# Patient Record
Sex: Female | Born: 1985 | Race: Black or African American | Hispanic: No | Marital: Married | Smoking: Never smoker
Health system: Southern US, Community
[De-identification: ages and names within clinical notes are randomized; demographics above are authoritative.]

## PROBLEM LIST (undated history)

## (undated) DIAGNOSIS — Z789 Other specified health status: Secondary | ICD-10-CM

## (undated) HISTORY — PX: TOOTH EXTRACTION: SUR596

---

## 2015-10-24 NOTE — L&D Delivery Note (Signed)
Patient complete and pushing. VBAC of viable female infant over intact perineum. Nuchal cord single with true knot, easily reduced on mom's abdomen. Infant delivered to mom's abdomen. Delayed cord clamping x 2 minute. Cord clamped x 2, cut. Spontaneous cry heard.   Cord blood obtained. Placenta delivered spontaneously and intact. LUS cleared of clot. Fundus firm on exam, pitocin running.  Lacerations: None Suture: none EBL: 100 cc Anesthesia: IV Fentanyl  Apgars: 9/9 Weight: pending, skin to skin  Instrument and sponge count x2 correct.   Deniece ReeJosue D Santos, MD 08/12/2016 12:17 PM   OB FELLOW DELIVERY ATTESTATION  I was gloved and present for the delivery in its entirety, and I agree with the above resident's note.    Ernestina PennaNicholas Schenk, MD 2:06 PM

## 2016-02-02 LAB — OB RESULTS CONSOLE HEPATITIS B SURFACE ANTIGEN: HEP B S AG: NEGATIVE

## 2016-02-02 LAB — OB RESULTS CONSOLE GC/CHLAMYDIA: Chlamydia: NEGATIVE

## 2016-06-07 LAB — OB RESULTS CONSOLE HIV ANTIBODY (ROUTINE TESTING): HIV: NONREACTIVE

## 2016-08-12 ENCOUNTER — Inpatient Hospital Stay (HOSPITAL_COMMUNITY)
Admission: AD | Admit: 2016-08-12 | Discharge: 2016-08-14 | DRG: 775 | Disposition: A | Discharge status: Home / Self Care | Payer: Managed Care, Other (non HMO) | Source: Ambulatory Visit | Attending: Obstetrics & Gynecology | Admitting: Obstetrics & Gynecology

## 2016-08-12 ENCOUNTER — Encounter (HOSPITAL_COMMUNITY): Payer: Self-pay | Admitting: *Deleted

## 2016-08-12 DIAGNOSIS — O42013 Preterm premature rupture of membranes, onset of labor within 24 hours of rupture, third trimester: Secondary | ICD-10-CM | POA: Diagnosis present

## 2016-08-12 DIAGNOSIS — O34211 Maternal care for low transverse scar from previous cesarean delivery: Secondary | ICD-10-CM | POA: Diagnosis not present

## 2016-08-12 DIAGNOSIS — Z3A35 35 weeks gestation of pregnancy: Secondary | ICD-10-CM

## 2016-08-12 HISTORY — DX: Other specified health status: Z78.9

## 2016-08-12 LAB — CBC
HEMATOCRIT: 34.4 % — AB (ref 36.0–46.0)
Hemoglobin: 12 g/dL (ref 12.0–15.0)
MCH: 32.3 pg (ref 26.0–34.0)
MCHC: 34.9 g/dL (ref 30.0–36.0)
MCV: 92.7 fL (ref 78.0–100.0)
PLATELETS: 416 10*3/uL — AB (ref 150–400)
RBC: 3.71 MIL/uL — ABNORMAL LOW (ref 3.87–5.11)
RDW: 14.4 % (ref 11.5–15.5)
WBC: 18.5 10*3/uL — ABNORMAL HIGH (ref 4.0–10.5)

## 2016-08-12 LAB — ABO/RH: ABO/RH(D): A POS

## 2016-08-12 LAB — POCT FERN TEST: POCT FERN TEST: POSITIVE

## 2016-08-12 LAB — TYPE AND SCREEN
ABO/RH(D): A POS
ANTIBODY SCREEN: NEGATIVE

## 2016-08-12 MED ORDER — DIBUCAINE 1 % RE OINT: 1.0000 "application " | TOPICAL_OINTMENT | RECTAL | Status: DC | PRN

## 2016-08-12 MED ORDER — SOD CITRATE-CITRIC ACID 500-334 MG/5ML PO SOLN: 30.0000 mL | ORAL | Status: DC | PRN

## 2016-08-12 MED ORDER — OXYTOCIN 40 UNITS IN LACTATED RINGERS INFUSION - SIMPLE MED
2.5000 [IU]/h | INTRAVENOUS | Status: DC
Start: 2016-08-12 — End: 2016-08-12
  Filled 2016-08-12: qty 1000

## 2016-08-12 MED ORDER — FLEET ENEMA 7-19 GM/118ML RE ENEM: 1.0000 | ENEMA | RECTAL | Status: DC | PRN

## 2016-08-12 MED ORDER — ACETAMINOPHEN 325 MG PO TABS: 650.0000 mg | ORAL_TABLET | ORAL | Status: DC | PRN

## 2016-08-12 MED ORDER — OXYCODONE-ACETAMINOPHEN 5-325 MG PO TABS: 2.0000 | ORAL_TABLET | ORAL | Status: DC | PRN

## 2016-08-12 MED ORDER — FENTANYL CITRATE (PF) 100 MCG/2ML IJ SOLN
100.0000 ug | INTRAMUSCULAR | Status: DC | PRN
  Administered 2016-08-12 (×2): 100 ug via INTRAVENOUS
  Filled 2016-08-12 (×2): qty 2

## 2016-08-12 MED ORDER — PENICILLIN G POTASSIUM 5000000 UNITS IJ SOLR
2.5000 10*6.[IU] | INTRAVENOUS | Status: DC
  Filled 2016-08-12 (×4): qty 2.5

## 2016-08-12 MED ORDER — DIPHENHYDRAMINE HCL 25 MG PO CAPS: 25.0000 mg | ORAL_CAPSULE | Freq: Four times a day (QID) | ORAL | Status: DC | PRN

## 2016-08-12 MED ORDER — OXYCODONE-ACETAMINOPHEN 5-325 MG PO TABS: 1.0000 | ORAL_TABLET | ORAL | Status: DC | PRN

## 2016-08-12 MED ORDER — BETAMETHASONE SOD PHOS & ACET 6 (3-3) MG/ML IJ SUSP
12.0000 mg | Freq: Once | INTRAMUSCULAR | Status: AC
  Administered 2016-08-12: 12 mg via INTRAMUSCULAR
  Filled 2016-08-12: qty 2

## 2016-08-12 MED ORDER — ONDANSETRON HCL 4 MG/2ML IJ SOLN: 4.0000 mg | Freq: Four times a day (QID) | INTRAMUSCULAR | Status: DC | PRN

## 2016-08-12 MED ORDER — LACTATED RINGERS IV SOLN: 500.0000 mL | INTRAVENOUS | Status: DC | PRN

## 2016-08-12 MED ORDER — TETANUS-DIPHTH-ACELL PERTUSSIS 5-2.5-18.5 LF-MCG/0.5 IM SUSP: 0.5000 mL | Freq: Once | INTRAMUSCULAR | Status: DC

## 2016-08-12 MED ORDER — ONDANSETRON HCL 4 MG PO TABS: 4.0000 mg | ORAL_TABLET | ORAL | Status: DC | PRN

## 2016-08-12 MED ORDER — PRENATAL MULTIVITAMIN CH
1.0000 | ORAL_TABLET | Freq: Every day | ORAL | Status: DC
  Administered 2016-08-13 – 2016-08-14 (×2): 1 via ORAL
  Filled 2016-08-12 (×2): qty 1

## 2016-08-12 MED ORDER — ZOLPIDEM TARTRATE 5 MG PO TABS: 5.0000 mg | ORAL_TABLET | Freq: Every evening | ORAL | Status: DC | PRN

## 2016-08-12 MED ORDER — COCONUT OIL OIL: 1.0000 "application " | TOPICAL_OIL | Status: DC | PRN

## 2016-08-12 MED ORDER — WITCH HAZEL-GLYCERIN EX PADS: 1.0000 "application " | MEDICATED_PAD | CUTANEOUS | Status: DC | PRN

## 2016-08-12 MED ORDER — BENZOCAINE-MENTHOL 20-0.5 % EX AERO: 1.0000 "application " | INHALATION_SPRAY | CUTANEOUS | Status: DC | PRN

## 2016-08-12 MED ORDER — SIMETHICONE 80 MG PO CHEW: 80.0000 mg | CHEWABLE_TABLET | ORAL | Status: DC | PRN

## 2016-08-12 MED ORDER — IBUPROFEN 600 MG PO TABS
600.0000 mg | ORAL_TABLET | Freq: Four times a day (QID) | ORAL | Status: DC
  Administered 2016-08-12 – 2016-08-14 (×7): 600 mg via ORAL
  Filled 2016-08-12 (×9): qty 1

## 2016-08-12 MED ORDER — OXYTOCIN 40 UNITS IN LACTATED RINGERS INFUSION - SIMPLE MED
INTRAVENOUS | Status: AC
  Filled 2016-08-12: qty 1000

## 2016-08-12 MED ORDER — SENNOSIDES-DOCUSATE SODIUM 8.6-50 MG PO TABS
2.0000 | ORAL_TABLET | ORAL | Status: DC
  Administered 2016-08-12 – 2016-08-14 (×2): 2 via ORAL
  Filled 2016-08-12 (×2): qty 2

## 2016-08-12 MED ORDER — LACTATED RINGERS IV SOLN
INTRAVENOUS | Status: DC
  Administered 2016-08-12: 10:00:00 via INTRAVENOUS

## 2016-08-12 MED ORDER — DEXTROSE 5 % IV SOLN
5.0000 10*6.[IU] | Freq: Once | INTRAVENOUS | Status: AC
  Administered 2016-08-12: 5 10*6.[IU] via INTRAVENOUS
  Filled 2016-08-12: qty 5

## 2016-08-12 MED ORDER — OXYTOCIN BOLUS FROM INFUSION
500.0000 mL | Freq: Once | INTRAVENOUS | Status: AC
  Administered 2016-08-12: 500 mL via INTRAVENOUS

## 2016-08-12 MED ORDER — LIDOCAINE HCL (PF) 1 % IJ SOLN
30.0000 mL | INTRAMUSCULAR | Status: DC | PRN
Start: 2016-08-12 — End: 2016-08-12
  Filled 2016-08-12: qty 30

## 2016-08-12 MED ORDER — ONDANSETRON HCL 4 MG/2ML IJ SOLN: 4.0000 mg | INTRAMUSCULAR | Status: DC | PRN

## 2016-08-12 NOTE — MAU Note (Signed)
Called Labor & Delivery with update on cervical change 7.5/90/-1 no bed available.

## 2016-08-12 NOTE — Progress Notes (Signed)
Dr. Genevie AnnSchenk informed of variable decels, pt very uncomfortable, has had one dose of fentanyl.  No new orders @ this time.

## 2016-08-12 NOTE — Lactation Note (Signed)
This note was copied from a baby's chart. Lactation Consultation Note  Patient Name: Megan Terrell ZOXWR'UToday's Date: 08/12/2016 Reason for consult: Initial assessment Baby at 6 hr of life. Upon entry DEBP was set up and green sheet was on the table. Mom has not used it yet. Reviewed the pump and milk storage. Mom stated she can manually express and has spoon in the room. She had questions about spoon feeding. She is worried because baby is very sleepy. She stated baby does latch well for about 5 minutes, then he will not "suck anymore".  Discussed LPT baby behavior, feeding frequency, pumping, supplementing guidelines, baby belly size, voids, wt loss, breast changes, and nipple care. Mom reported that baby should eat again at 1945. Left lactation number on the white board for mom to call. Given lactation handouts. Aware of OP services and support group.    Maternal Data Has patient been taught Hand Expression?: Yes Does the patient have breastfeeding experience prior to this delivery?: No  Feeding Feeding Type: Breast Fed Length of feed: 7 min  LATCH Score/Interventions                      Lactation Tools Discussed/Used Pump Review: Setup, frequency, and cleaning;Milk Storage Date initiated:: 08/12/16   Consult Status Consult Status: Follow-up Date: 08/12/16 Follow-up type: In-patient    [name] Megan Terrell 08/12/2016, 6:49 PM

## 2016-08-12 NOTE — H&P (Signed)
LABOR AND DELIVERY ADMISSION HISTORY AND PHYSICAL NOTE  Megan LucksLashanda Terrell is a 30 y.o. female [phone] with IUP at 7684w6d by early ultrasound presenting for pre-term labor with SROM at 600AM this AM. [facility] and was planning on delivering there, but since she was pre-term came here.  Her last delivery was via c-section for arrest of dilation. This was 1 year ago. She had successful delivery after that.    She reports positive fetal movement. She denies leakage of fluid or vaginal bleeding.  Prenatal History/Complications:  Past Medical History: Past Medical History:  Diagnosis Date  . Medical history non-contributory     Past Surgical History: Past Surgical History:  Procedure Laterality Date  . CESAREAN SECTION    . TOOTH EXTRACTION      Obstetrical History: OB History    Gravida Para Term Preterm AB Living   5 2 2   2 2    SAB TAB Ectopic Multiple Live Births     2     2      Social History: Social History   Social History  . Marital status: Married    Spouse name: N/A  . Number of children: N/A  . Years of education: N/A   Social History Main Topics  . Smoking status: Never Smoker  . Smokeless tobacco: Never Used  . Alcohol use No  . Drug use: No  . Sexual activity: Yes   Other Topics Concern  . None   Social History Narrative  . None    Family History: History reviewed. No pertinent family history.  Allergies: No Known Allergies  Prescriptions Prior to Admission  Medication Sig Dispense Refill Last Dose  . acetaminophen (TYLENOL) 500 MG tablet Take 1,000 mg by mouth every 6 (six) hours as needed for mild pain, moderate pain or headache.   Past Month at Unknown time  . metroNIDAZOLE (FLAGYL) 500 MG tablet Take 500 mg by mouth 2 (two) times daily.   08/11/2016 at Unknown time  . nystatin-triamcinolone (MYCOLOG II) cream Apply 1 application topically 2 (two) times daily as needed (for irritation).   Past Week at Unknown time  .  Prenatal MV-Min-FA-Omega-3 (PRENATAL GUMMIES/DHA & FA) 0.4-32.5 MG CHEW Chew 2 each by mouth daily.   08/11/2016 at Unknown time     Review of Systems   All systems reviewed and negative except as stated in HPI  Blood pressure 124/74, pulse 89, temperature 98.2 F (36.8 C), temperature source Oral, resp. rate 18. General appearance: alert, cooperative and appears stated age Lungs: clear to auscultation bilaterally Heart: regular rate and rhythm Abdomen: soft, non-tender; bowel sounds normal Extremities: No calf swelling or tenderness Presentation: cephalic by ultrasound in MAU Fetal monitoring: category 2, occasional varriables Uterine activity: contractions every 5 minutes Dilation: 7.5 Effacement (%): 90 Station: -1 Exam by:: Laurell Josephsheryl Anderson RN   Prenatal labs: ABO, Rh:   A+ Antibody:   negative Rubella: !Error! RPR:    HBsAg:   negative HIV:   Non-reactive GBS:   not colected 1 hr Glucola: 108 Anatomy US: normal   Prenatal Transfer Tool  Maternal Diabetes: No Genetic Screening: Declined Maternal Ultrasounds/Referrals: Normal Fetal Ultrasounds or other Referrals:  None Maternal Substance Abuse:  No Significant Maternal Medications:  None Significant Maternal Lab Results: None  Results for orders placed or performed during the hospital encounter of 08/12/16 (from the past 24 hour(s))  Fern Test   Collection Time: 08/12/16  7:57 AM  Result Value Ref  Range   POCT Fern Test Positive = ruptured amniotic membanes   CBC   Collection Time: 08/12/16  9:44 AM  Result Value Ref Range   WBC 18.5 (H) 4.0 - 10.5 K/uL   RBC 3.71 (L) 3.87 - 5.11 MIL/uL   Hemoglobin 12.0 12.0 - 15.0 g/dL   HCT 16.1 (L) 09.6 - 04.5 %   MCV 92.7 78.0 - 100.0 fL   MCH 32.3 26.0 - 34.0 pg   MCHC 34.9 30.0 - 36.0 g/dL   RDW 40.9 81.1 - 91.4 %   Platelets 416 (H) 150 - 400 K/uL    There are no active problems to display for this patient.   Assessment: Megan Terrell is a 30 y.o. [id number]  at [redacted]w[redacted]d here for Pre-term labor with SROM @ 6AM  #Labor:PRe-term labor, expectant management, not safe for transfer to her institution of pre-natal care.  #Pain: IV fentanyl, by the time made to L& D to late for epidrual #FWB: Category 2 occasional varriables #ID:  GBS unknown and pre-term so will treat #MOF: breast #pre-term labor: given one-dose of betamethasone in MAU  Ernestina Penna 08/12/2016, 11:06 AM

## 2016-08-12 NOTE — Lactation Note (Signed)
This note was copied from a baby's chart. Lactation Consultation Note  Patient Name: Megan Terrell ZOXWR'UToday's Date: 08/12/2016 Reason for consult: Follow-up assessment Baby at 8 hr of life. Mom called for latch help. Mom has large breast with nipples at the bottom of the breast. Mom likes to hold baby in her lap with baby latched to just the tip of the nipple. Demonstrated cross cradle, mom was nervious that baby could not breath. Mom did well keeping baby going for 10 minutes of good sucking. Mom was able to easily express large drops of colostrum on the L breast and small drops on the R breast. Mom was ok with latching, manual expression, and post pumping at this feeding. She stated she was tired and not sure she can keep "all of this up by myself". Encouraged to call for help as needed and stressed that this feeding plan can be changed to introduce formula if she would like. She desires to ebf at this time. She will offer the breast on demand 8+/24hr, post pump, and supplement with her milk or formula per LPT infant guidelines. RN given report.   Maternal Data Has patient been taught Hand Expression?: Yes Does the patient have breastfeeding experience prior to this delivery?: No  Feeding Feeding Type: Breast Fed Length of feed: 20 min  LATCH Score/Interventions Latch: Grasps breast easily, tongue down, lips flanged, rhythmical sucking. Intervention(s): Breast compression;Adjust position  Audible Swallowing: A few with stimulation Intervention(s): Hand expression;Skin to skin Intervention(s): Alternate breast massage  Type of Nipple: Everted at rest and after stimulation  Comfort (Breast/Nipple): Soft / non-tender     Hold (Positioning): Assistance needed to correctly position infant at breast and maintain latch. Intervention(s): Support Pillows;Position options  LATCH Score: 8  Lactation Tools Discussed/Used Pump Review: Setup, frequency, and cleaning;Milk Storage Date  initiated:: 08/12/16   Consult Status Consult Status: Follow-up Date: 08/13/16 Follow-up type: In-patient    Megan Terrell 08/12/2016, 8:10 PM

## 2016-08-12 NOTE — MAU Note (Signed)
Pt states she started trickling fluid approx 2 hours ago, was clear fluid, now pink.  Denies bleeding.  Having mild cramping.

## 2016-08-12 NOTE — MAU Note (Signed)
Spoke with Stephens ShireH. Santos MD patient now 7.5/90 he will communicate this to Dr. Genevie AnnSchenk.

## 2016-08-13 LAB — RPR: RPR: NONREACTIVE

## 2016-08-13 LAB — RUBELLA SCREEN: Rubella: 1.52 index (ref >0.99)

## 2016-08-13 MED ORDER — CEPHALEXIN 500 MG PO CAPS
500.0000 mg | ORAL_CAPSULE | Freq: Four times a day (QID) | ORAL | Status: DC
  Administered 2016-08-13 – 2016-08-14 (×7): 500 mg via ORAL
  Filled 2016-08-13 (×10): qty 1

## 2016-08-13 NOTE — Lactation Note (Signed)
This note was copied from a baby's chart. Lactation Consultation Note  Patient Name: Megan Terrell ZOXWR'UToday's Date: 08/13/2016 Reason for consult: Follow-up assessment Baby at 32 hr of life. Mom called for lactation because she bf baby on each breast for 10 minutes then offered 10 ml of breast milk and baby was still "acting hungry". Upon entry mom was trying to spoon feed. Baby appeared unsettle but was not giving hunger cues. Baby had a wet and stool diaper, after FOB changed baby's diaper and swaddled him, he calmed. Had mom use the DEBP while FOB was tending to baby. Mom is worried that she is not making enough milk. Discussed LPT infant behavior and feeding guidelines. She wants to continue with ebf for now but is aware that supplementing with formula is possible. Mom will offer the breast q3hr on demand and supplement after each bf per volume guidelines. She will try to use the DEBP after each feeding. She will call for help as needed.   Maternal Data    Feeding Feeding Type: Breast Fed Length of feed: 20 min  LATCH Score/Interventions                      Lactation Tools Discussed/Used     Consult Status Consult Status: Follow-up Date: 08/14/16 Follow-up type: In-patient    [name] 08/13/2016, 8:27 PM

## 2016-08-13 NOTE — Lactation Note (Signed)
This note was copied from a baby's chart. Lactation Consultation Note  Patient Name: Megan Valentina LucksLashanda Gwinner WUJWJ'XToday's Date: 08/13/2016 Reason for consult: Follow-up assessment Baby at 28 hr of life. Mom has been bf on demand. She has not been pumping or manually expressing after bf. Stressed the importance of offering her milk in addition to bf per LPT infant feeding policy. At this visit mom was able to easily manually express large drops of colostrum bilaterally. Assisted her with spoon feeding. She is worried because the milk is clear to white in color, assured her this is normal. Mom will offer the breast on demand q3hr and supplement after every bf. She will use the DEBP in addition to manual express and bf. She is aware that she will need to use formula if she does not have expressed milk or chooses not to express her milk. She has lactation number on the white to call for help as needed.     Maternal Data    Feeding Feeding Type: Breast Fed Length of feed: 15 min  LATCH Score/Interventions Latch: Grasps breast easily, tongue down, lips flanged, rhythmical sucking.  Audible Swallowing: Spontaneous and intermittent  Type of Nipple: Everted at rest and after stimulation  Comfort (Breast/Nipple): Soft / non-tender     Hold (Positioning): No assistance needed to correctly position infant at breast.  LATCH Score: 10  Lactation Tools Discussed/Used     Consult Status Consult Status: Follow-up Date: 08/14/16 Follow-up type: In-patient    Megan Terrell 08/13/2016, 4:25 PM

## 2016-08-13 NOTE — Progress Notes (Signed)
Post Partum Day 1 Subjective: no complaints, up ad lib, voiding and tolerating PO  Objective: Blood pressure 118/63, pulse 88, temperature 98 F (36.7 C), temperature source Oral, resp. rate 20, height 5\' 4"  (1.626 m), weight 273 lb (123.8 kg), SpO2 99 %, unknown if currently breastfeeding.  Physical Exam:  General: alert, cooperative, appears stated age and no distress Lochia: appropriate Uterine Fundus: firm Incision: n/a DVT Evaluation: No evidence of DVT seen on physical exam.   Recent Labs  08/12/16 0944  HGB 12.0  HCT 34.4*    Assessment/Plan: Plan for discharge tomorrow   LOS: 1 day   Megan Terrell 08/13/2016, 8:48 AM

## 2016-08-14 ENCOUNTER — Ambulatory Visit: Payer: Self-pay

## 2016-08-14 MED ORDER — IBUPROFEN 600 MG PO TABS: 600.0000 mg | ORAL_TABLET | Freq: Four times a day (QID) | ORAL | 0 refills | Status: AC

## 2016-08-14 MED ORDER — CEPHALEXIN 500 MG PO CAPS: 500.0000 mg | ORAL_CAPSULE | Freq: Four times a day (QID) | ORAL | 0 refills | Status: AC

## 2016-08-14 NOTE — Lactation Note (Addendum)
This note was copied from a baby's chart. Lactation Consultation Note LPI 35 6/7 weeks. Baby laying between moms breast STS. Mom stated her milk is coming in. Noted moms breast looking as if filling. Breast heavy, hand expressed squirting colostrum. Swaddled baby and placed in crib. Baby jittery. Monitored. Encouraged mom to pump w/DEBP. Noted small amount. Hand expressed 8ml from Rt. Breast 4ml lt. Breast. Discussed filling, and engorgement management and prevention. Mom has a 9510 months old at home that she BF for 3-4 months.  Noted baby laying in crib jittery swaddled. Temp. Taken by tech. Slightly warm. Informed has been STS for most of night per mom. Baby had 2 blankets on. 1 blanket was removed by tech.  Discussed w/mom the need to supplement after BF w/colostrum or formula or both. Encouraged to give colostrum first. Mom has been spoon feeding colostrum. Gave foley cup w/demonstration. Mom tried to wake baby to give colostrum. Baby not interested. W/gloved finger tried to stimulate to suckle. Noted very high palate, no good suck noted.  Notified RN of jittery being swaddled. Blood sugar 95.  Patient Name: Megan Terrell [name] ZOXWR'UToday's Date: 08/14/2016 Reason for consult: Follow-up assessment;Infant < 6lbs;Late preterm infant   Maternal Data    Feeding Feeding Type: Breast Fed Length of feed: 15 min  LATCH Score/Interventions Latch: Too sleepy or reluctant, no latch achieved, no sucking elicited. Intervention(s): Skin to skin;Waking techniques Intervention(s): Breast compression;Breast massage  Intervention(s): Hand expression;Alternate breast massage  Type of Nipple: Everted at rest and after stimulation  Comfort (Breast/Nipple): Filling, red/small blisters or bruises, mild/mod discomfort  Problem noted: Filling Interventions (Filling): Massage;Double electric pump  Intervention(s): Breastfeeding basics reviewed;Support Pillows;Position options;Skin to skin     Lactation Tools  Discussed/Used Tools: Pump Breast pump type: Double-Electric Breast Pump   Consult Status Consult Status: Follow-up Date: 08/14/16 Follow-up type: In-patient    Anterrio Mccleery, Diamond NickelLAURA G 08/14/2016, 3:50 AM

## 2016-08-14 NOTE — Discharge Summary (Signed)
        OB Discharge Summary  Patient Name: Megan Terrell DOB: 04/28/1986 MRN: [id number]  Date of admission: 08/12/2016 Delivering MD: Youlanda MightySANTOS, JOSUE D   Date of discharge: 08/14/2016  Admitting diagnosis: 4736 WEEKS AND THINKS HER WATER BROKE. Intrauterine pregnancy: 7443w6d     Secondary diagnosis:Active Problems:   Preterm labor  Additional problems:none     Discharge diagnosis: Preterm Pregnancy Delivered                                                                     Post partum procedures:n/a  Augmentation: none  Complications: None  Hospital course:  Onset of Labor With Vaginal Delivery     30 y.o. yo [id number] at 4443w6d was admitted in Active Labor on 08/12/2016. Patient had an uncomplicated labor course as follows:  Membrane Rupture Time/Date: 5:45 AM ,08/12/2016   Intrapartum Procedures: Episiotomy: None [1]                                         Lacerations:  None [1]  Patient had a delivery of a Viable infant. 08/12/2016  Information for the patient's newborn:  Cecilie LowersSedjro, Boy Kenlei [[id number]][[id number]]  Delivery Method: VBAC, Spontaneous (Filed from Delivery Summary)    Pateint had an uncomplicated postpartum course.  She is ambulating, tolerating a regular diet, passing flatus, and urinating well. Patient is discharged home in stable condition on 08/14/16.    Physical exam Vitals:   08/12/16 1500 08/12/16 2000 08/13/16 0500 08/13/16 1741  BP: 128/64 (!) 118/55 118/63 (!) 116/48  Pulse: 91 82 88 82  Resp: 20 18 20 20   Temp: 98.5 F (36.9 C) 98.2 F (36.8 C) 98 F (36.7 C) 98.6 F (37 C)  TempSrc: Oral Axillary Oral Oral  SpO2: 99% 98% 99%   Weight:      Height:       General: alert, cooperative and no distress Lochia: appropriate Uterine Fundus: firm Incision: N/A DVT Evaluation: No evidence of DVT seen on physical exam. Labs: Lab Results  Component Value Date   WBC 18.5 (H) 08/12/2016   HGB 12.0 08/12/2016   HCT 34.4 (L) 08/12/2016   MCV 92.7  08/12/2016   PLT 416 (H) 08/12/2016   No flowsheet data found.  Discharge instruction: per After Visit Summary and "Baby and Me Booklet".    Diet: routine diet  Activity: Advance as tolerated. Pelvic rest for 6 weeks.   Outpatient follow up:6 weeks Follow up Appt:No future appointments. Follow up visit: No Follow-up on file.  Postpartum contraception: Nexplanon  Newborn Data: Live born female  Birth Weight: 5 lb 7.7 oz (2485 g) APGAR: 9, 9  Baby Feeding: Bottle and Breast Disposition:home with mother   08/14/2016 Wyvonnia DuskyMarie Lawson, CNM

## 2016-08-14 NOTE — Lactation Note (Signed)
This note was copied from a baby's chart. Lactation Consultation Note Follow up visit at 58 hours of age.  Mom reports waking baby for feedings, baby latching for about 10 minutes and then she is supplementing with EBM in bottle.  Mom reports baby is sleepy during bottle and only took 20 of 25 mls at last feeding.  LC encouraged mom to offer at least 20mls at each feeding and increase as baby tolerates.  Mom to call RN for assist if baby is not tolerating feeding.  Mom reports baby is latching well and she denies concerns at this time.   Baby has had 8+ feedings with supplement with EBM.  Mom reports she is post pumping with each feeding.  Baby has had 7voids and 5 stools in past 24 hours.   Mom to call for assist as needed.    Patient Name: Megan Valentina LucksLashanda Guido UJWJX'BToday's Date: 08/14/2016 Reason for consult: Follow-up assessment;Infant < 6lbs;Late preterm infant   Maternal Data    Feeding Feeding Type: Bottle Fed - Breast Milk Nipple Type: Slow - flow Length of feed: 10 min  LATCH Score/Interventions                      Lactation Tools Discussed/Used     Consult Status Consult Status: Follow-up Date: 08/15/16 Follow-up type: In-patient    Shoptaw, Arvella MerlesJana Lynn 08/14/2016, 10:26 PM

## 2016-08-14 NOTE — Lactation Note (Signed)
This note was copied from a baby's chart. Lactation Consultation Note LPI is  Patient Name: Megan Terrell UJWJX'BToday's Date: 08/14/2016 Reason for consult: Follow-up assessment;Infant < 6lbs;Late preterm infant   Maternal Data    Feeding    LATCH Score/Interventions                      Lactation Tools Discussed/Used     Consult Status Consult Status: Follow-up Date: 08/14/16 (in pm) Follow-up type: In-patient    Teon Hudnall, Diamond NickelLAURA G 08/14/2016, 3:13 AM

## 2016-08-14 NOTE — Lactation Note (Signed)
This note was copied from a baby's chart. Lactation Consultation Note  Patient Name: Megan Terrell Valentina LucksLashanda Tramble UJWJX'BToday's Date: 08/14/2016 Reason for consult: Follow-up assessment  Mom has visitors in room; she would like Lactation to return at a later time. I requested that she call lactation to come back later to assess breastfeeding. Mom was made aware that since infant is now 6948 hours old, infant likely needs more volume when supplementing. [name], [facility] 08/14/2016, 12:10 PM

## 2016-08-15 ENCOUNTER — Ambulatory Visit: Payer: Self-pay

## 2016-08-15 NOTE — Lactation Note (Addendum)
This note was copied from a baby's chart. Lactation Consultation Note  Baby is late preterm infant.  Assistance provided with feeding plan. Baby 69 hours old.  7037w6d.  < 6lbs. Mother recently pumped approx 30 ml and FOB gave baby 20 ml with slow flow nipple. FOB then gave the additional 10 ml. Infant sleeping after feeding in FOB's arms. [facility]. Left LC phone number and suggest they call with next feeding. Reviewed volume guidelines. Mom encouraged to feed baby 8-12 times/24 hours and with feeding cues at least every 3 hours. Reviewed engorgement care and monitoring voids/stools.   Patient Name: Megan Terrell ZOXWR'UToday's Date: 08/15/2016 Reason for consult: Infant < 6lbs;Late preterm infant;Follow-up assessment   Maternal Data    Feeding Feeding Type: Bottle Fed - Breast Milk Length of feed: 20 min  LATCH Score/Interventions                      Lactation Tools Discussed/Used     Consult Status Consult Status: Follow-up Date: 08/16/16 Follow-up type: In-patient    [name], Betzabe Bevans [facility] 08/15/2016, 9:09 AM

## 2016-08-15 NOTE — Lactation Note (Signed)
This note was copied from a baby's chart. Lactation Consultation Note  Follow up due to check latch on late preterm infant. Mother latched baby in cross cradle hold.  A few swallows observed  With stimulation and then baby became sleepy. Demonstrated how to compress/massage breast to keep baby active. Mother tends to hold her breast tissue back from infant's nose - provided education. Suggest if baby is sleepy at the breast to stop feeding after 10 min. If baby is vigorous at the breast mother can bf infant for up to 30 min. Give supplemental breastmilk after each feeding. Mother [facility]. Discussed how to warm breastmilk.  Patient Name: Megan Terrell's Date: 08/15/2016 Reason for consult: Follow-up assessment   Maternal Data    Feeding Feeding Type: Breast Fed Length of feed: 8 min  LATCH Score/Interventions Latch: Grasps breast easily, tongue down, lips flanged, rhythmical sucking. Intervention(s): Waking techniques  Audible Swallowing: A few with stimulation  Type of Nipple: Everted at rest and after stimulation  Comfort (Breast/Nipple): Soft / non-tender     Hold (Positioning): Assistance needed to correctly position infant at breast and maintain latch.  LATCH Score: 8  Lactation Tools Discussed/Used     Consult Status Consult Status: Complete Date: 08/16/16 Follow-up type: In-patient    [name], [name] 08/15/2016, 10:14 AM

## 2017-07-05 ENCOUNTER — Encounter (HOSPITAL_COMMUNITY): Payer: Self-pay

## 2017-07-05 ENCOUNTER — Emergency Department (HOSPITAL_COMMUNITY)
Admission: EM | Admit: 2017-07-05 | Discharge: 2017-07-05 | Disposition: A | Discharge status: Home / Self Care | Payer: 59 | Attending: Emergency Medicine | Admitting: Emergency Medicine

## 2017-07-05 ENCOUNTER — Emergency Department (HOSPITAL_COMMUNITY): Payer: 59

## 2017-07-05 DIAGNOSIS — R0789 Other chest pain: Secondary | ICD-10-CM | POA: Insufficient documentation

## 2017-07-05 DIAGNOSIS — Z79899 Other long term (current) drug therapy: Secondary | ICD-10-CM | POA: Diagnosis not present

## 2017-07-05 DIAGNOSIS — R079 Chest pain, unspecified: Secondary | ICD-10-CM | POA: Diagnosis present

## 2017-07-05 LAB — CBC
HCT: 37.1 % (ref 36.0–46.0)
Hemoglobin: 12.3 g/dL (ref 12.0–15.0)
MCH: 30.6 pg (ref 26.0–34.0)
MCHC: 33.2 g/dL (ref 30.0–36.0)
MCV: 92.3 fL (ref 78.0–100.0)
Platelets: 356 10*3/uL (ref 150–400)
RBC: 4.02 MIL/uL (ref 3.87–5.11)
RDW: 13.6 % (ref 11.5–15.5)
WBC: 8.2 10*3/uL (ref 4.0–10.5)

## 2017-07-05 LAB — BASIC METABOLIC PANEL
ANION GAP: 6 (ref 5–15)
BUN: 12 mg/dL (ref 6–20)
CALCIUM: 9.2 mg/dL (ref 8.9–10.3)
CHLORIDE: 108 mmol/L (ref 101–111)
CO2: 24 mmol/L (ref 22–32)
Creatinine, Ser: 0.75 mg/dL (ref 0.44–1.00)
GFR calc non Af Amer: >60 mL/min (ref >60)
Glucose, Bld: 101 mg/dL — ABNORMAL HIGH (ref 65–99)
Potassium: 4.1 mmol/L (ref 3.5–5.1)
Sodium: 138 mmol/L (ref 135–145)

## 2017-07-05 LAB — I-STAT TROPONIN, ED
TROPONIN I, POC: 0 ng/mL (ref 0.00–0.08)
Troponin i, poc: 0 ng/mL (ref 0.00–0.08)

## 2017-07-05 LAB — I-STAT BETA HCG BLOOD, ED (MC, WL, AP ONLY)

## 2017-07-05 MED ORDER — KETOROLAC TROMETHAMINE 15 MG/ML IJ SOLN
15.0000 mg | Freq: Once | INTRAMUSCULAR | Status: AC
  Administered 2017-07-05: 15 mg via INTRAMUSCULAR
  Filled 2017-07-05: qty 1

## 2017-07-05 NOTE — ED Provider Notes (Signed)
[facility]-EMERGENCY DEPT Provider Note   CSN: [id number] Arrival date & time: 07/05/17  1110     History   Chief Complaint Chief Complaint  Patient presents with  . Chest Pain  . Cough    HPI Megan Terrell is a 31 y.o. female.  HPI Patient presents to ED for evaluation of chest pain that has been intermittent for the past week. Pain is located mid sternally and worse with lifting, inspiration. States that she did lift a heavy object at work when symptoms began one week ago. Reports history of similar symptoms in the past that have been intermittent. She denies any cough, hemoptysis, palpitations, leg swelling, recent surgery, history of cancer, OCP use, prior DVT or PE, recent prolonged travel, history of cardiac issues, fever  Past Medical History:  Diagnosis Date  . Medical history non-contributory     Patient Active Problem List   Diagnosis Date Noted  . Preterm labor 08/12/2016    Past Surgical History:  Procedure Laterality Date  . CESAREAN SECTION    . TOOTH EXTRACTION      OB History    Gravida Para Term Preterm AB Living   SAB TAB Ectopic Multiple Live Births     2   0 3       Home Medications    Prior to Admission medications   Medication Sig Start Date End Date Taking? Authorizing Provider  acetaminophen (TYLENOL) 500 MG tablet Take 1,000 mg by mouth every 6 (six) hours as needed for mild pain, moderate pain or headache.    [provider]  cephALEXin (KEFLEX) 500 MG capsule Take 1 capsule (500 mg total) by mouth 4 (four) times daily. 08/14/16   Montez Morita, CNM  ibuprofen (ADVIL,MOTRIN) 600 MG tablet Take 1 tablet (600 mg total) by mouth every 6 (six) hours. 08/14/16   Montez Morita, CNM  nystatin-triamcinolone (MYCOLOG II) cream Apply 1 application topically 2 (two) times daily as needed (for irritation).    [provider]  Prenatal MV-Min-FA-Omega-3 (PRENATAL GUMMIES/DHA & FA) 0.4-32.5 MG CHEW Chew 2 each by mouth  daily.    [provider]    Family History No family history on file.  Social History Social History  Substance Use Topics  . Smoking status: Never Smoker  . Smokeless tobacco: Never Used  . Alcohol use No     Allergies   Patient has no known allergies.   Review of Systems Review of Systems  Constitutional: Negative for appetite change, chills and fever.  HENT: Negative for ear pain, rhinorrhea, sneezing and sore throat.   Eyes: Negative for photophobia and visual disturbance.  Respiratory: Negative for cough, chest tightness, shortness of breath and wheezing.   Cardiovascular: Positive for chest pain. Negative for palpitations.  Gastrointestinal: Negative for abdominal pain, blood in stool, constipation, diarrhea, nausea and vomiting.  Genitourinary: Negative for dysuria, hematuria and urgency.  Musculoskeletal: Negative for myalgias.  Skin: Negative for rash.  Neurological: Negative for dizziness, weakness and light-headedness.     Physical Exam Updated Vital Signs BP 112/88   Pulse 80   Temp 98.3 F (36.8 C) (Oral)   Resp 18   SpO2 99%   Physical Exam  Constitutional: She appears well-developed and well-nourished. No distress.  Nontoxic appearing and in no acute distress. Resting comfortably on bed and speaking in full sentences.  HENT:  Head: Normocephalic and atraumatic.  Nose: Nose normal.  Eyes: Conjunctivae and EOM are  normal. Left eye exhibits no discharge. No scleral icterus.  Neck: Normal range of motion. Neck supple.  Cardiovascular: Normal rate, regular rhythm, normal heart sounds and intact distal pulses.  Exam reveals no gallop and no friction rub.   No murmur heard. Pulmonary/Chest: Effort normal and breath sounds normal. No respiratory distress. She exhibits tenderness.    Abdominal: Soft. Bowel sounds are normal. She exhibits no distension. There is no tenderness. There is no guarding.  Musculoskeletal: Normal range of motion. She  exhibits no edema.  No lower extremity edema noted. No calf tenderness.  Neurological: She is alert. She exhibits normal muscle tone. Coordination normal.  Skin: Skin is warm and dry. No rash noted.  Psychiatric: She has a normal mood and affect.  Nursing note and vitals reviewed.    ED Treatments / Results  Labs (all labs ordered are listed, but only abnormal results are displayed) Labs Reviewed  BASIC METABOLIC PANEL - Abnormal; Notable for the following:       Result Value   Glucose, Bld 101 (*)    All other components within normal limits  CBC  I-STAT TROPONIN, ED  I-STAT TROPONIN, ED  I-STAT BETA HCG BLOOD, ED ([facility], [facility], [facility] ONLY)    EKG  EKG Interpretation None       Radiology Dg Chest 2 View  Result Date: 07/05/2017 CLINICAL DATA:  Sharp chest pain for 1 week. EXAM: CHEST  2 VIEW COMPARISON:  None. FINDINGS: The heart size and mediastinal contours are within normal limits. Both lungs are clear. The visualized skeletal structures are unremarkable. IMPRESSION: No active cardiopulmonary disease. Electronically Signed   By: Signa Kell M.D.   On: 07/05/2017 11:34    Procedures Procedures (including critical care time)  Medications Ordered in ED Medications  ketorolac (TORADOL) 15 MG/ML injection 15 mg (not administered)     Initial Impression / Assessment and Plan / ED Course  I have reviewed the triage vital signs and the nursing notes.  Pertinent labs & imaging results that were available during my care of the patient were reviewed by me and considered in my medical decision making (see chart for details).     Patient presents to ED for evaluation of intermittent chest pain for the past week.Pain worse with lifting and inspiration. She did lift a heavy object at work when symptoms began. History of similar symptoms have resolved without intervention in the past. On physical exam she does have tenderness to palpation of the chest as indicated in the image  above. She has no lower extremity edema that would concern me for a DVT. She has no history of PE, hemoptysis, recent surgery, OCP use, history of cancer. She is PERC negative. Troponin negative 2. EKG shows normal sinus rhythm. Laboratory including CBC, BMP and hCG unremarkable. She is afebrile. X-ray with no evidence of acute cardiopulmonary disease. I suspect that her symptoms could be due to a musculoskeletal strain in the area and chest wall pain. I have low suspicion for PE based on her risk factors. She has no other cardiac history and both troponins were negative so I have low suspicion for cardiac cause of her chest pain. Patient given Toradol here in the ED and advised to continue anti-inflammatories, heat therapy for symptoms. Patient appears stable for discharge at this time. Strict return precautions given.  Final Clinical Impressions(s) / ED Diagnoses   Final diagnoses:  Chest wall pain    New Prescriptions New Prescriptions   No medications on file  Dietrich PatesKhatri, Shamela Haydon, PA-C 07/05/17 1849    Benjiman CorePickering, Nathan, MD 07/06/17 [phone]

## 2017-07-05 NOTE — Discharge Instructions (Signed)
Please read attached information regarding your condition and heat therapy. Take ibuprofen or Aleve as needed for pain. Return to ED for severe chest pain, trouble breathing, coughing up blood, leg swelling, fevers.

## 2017-07-05 NOTE — ED Triage Notes (Signed)
Patient complains of 1 week of sharp chest pain. States that she does a lot of lifting at work and the pain is worse with inspiration, NAD

## 2020-01-08 ENCOUNTER — Ambulatory Visit: Payer: 59 | Attending: Internal Medicine

## 2020-01-08 DIAGNOSIS — Z20822 Contact with and (suspected) exposure to covid-19: Secondary | ICD-10-CM | POA: Insufficient documentation

## 2020-01-10 LAB — NOVEL CORONAVIRUS, NAA: SARS-CoV-2, NAA: NOT DETECTED

## 2020-01-12 ENCOUNTER — Telehealth: Payer: Self-pay

## 2020-01-12 NOTE — Telephone Encounter (Signed)
Pt called to get COVID results, made her aware they are negative. Pt has two charts in Epic, requested Merge so that pt will be able to access results through MyChart

## 2020-10-23 ENCOUNTER — Emergency Department (HOSPITAL_COMMUNITY)
Admission: EM | Admit: 2020-10-23 | Discharge: 2020-10-23 | Disposition: A | Discharge status: Home / Self Care | Payer: 59 | Attending: Emergency Medicine | Admitting: Emergency Medicine

## 2020-10-23 ENCOUNTER — Other Ambulatory Visit: Payer: Self-pay

## 2020-10-23 ENCOUNTER — Emergency Department (HOSPITAL_COMMUNITY): Payer: 59

## 2020-10-23 DIAGNOSIS — D72829 Elevated white blood cell count, unspecified: Secondary | ICD-10-CM | POA: Diagnosis not present

## 2020-10-23 DIAGNOSIS — N390 Urinary tract infection, site not specified: Secondary | ICD-10-CM | POA: Insufficient documentation

## 2020-10-23 DIAGNOSIS — N12 Tubulo-interstitial nephritis, not specified as acute or chronic: Secondary | ICD-10-CM | POA: Diagnosis not present

## 2020-10-23 DIAGNOSIS — R109 Unspecified abdominal pain: Secondary | ICD-10-CM | POA: Diagnosis present

## 2020-10-23 LAB — BASIC METABOLIC PANEL
Anion gap: 11 (ref 5–15)
BUN: 13 mg/dL (ref 6–20)
CO2: 22 mmol/L (ref 22–32)
Calcium: 9.5 mg/dL (ref 8.9–10.3)
Chloride: 105 mmol/L (ref 98–111)
Creatinine, Ser: 0.83 mg/dL (ref 0.44–1.00)
GFR, Estimated: >60 mL/min (ref >60)
Glucose, Bld: 124 mg/dL — ABNORMAL HIGH (ref 70–99)
Potassium: 3.4 mmol/L — ABNORMAL LOW (ref 3.5–5.1)
Sodium: 138 mmol/L (ref 135–145)

## 2020-10-23 LAB — URINALYSIS, ROUTINE W REFLEX MICROSCOPIC
Bacteria, UA: NONE SEEN
Bilirubin Urine: NEGATIVE
Glucose, UA: NEGATIVE mg/dL
Ketones, ur: NEGATIVE mg/dL
Nitrite: NEGATIVE
Protein, ur: 30 mg/dL — AB
Specific Gravity, Urine: 1.014 (ref 1.005–1.030)
WBC, UA: >50 WBC/hpf — ABNORMAL HIGH (ref 0–5)
pH: 6 (ref 5.0–8.0)

## 2020-10-23 LAB — CBC
HCT: 37.6 % (ref 36.0–46.0)
Hemoglobin: 12.7 g/dL (ref 12.0–15.0)
MCH: 32.1 pg (ref 26.0–34.0)
MCHC: 33.8 g/dL (ref 30.0–36.0)
MCV: 94.9 fL (ref 80.0–100.0)
Platelets: 396 10*3/uL (ref 150–400)
RBC: 3.96 MIL/uL (ref 3.87–5.11)
RDW: 13.7 % (ref 11.5–15.5)
WBC: 15.8 10*3/uL — ABNORMAL HIGH (ref 4.0–10.5)
nRBC: 0 % (ref 0.0–0.2)

## 2020-10-23 LAB — LACTIC ACID, PLASMA: Lactic Acid, Venous: 1.2 mmol/L (ref 0.5–1.9)

## 2020-10-23 LAB — I-STAT BETA HCG BLOOD, ED (MC, WL, AP ONLY): I-stat hCG, quantitative: <5 m[IU]/mL (ref <5)

## 2020-10-23 MED ORDER — CEPHALEXIN 500 MG PO CAPS: 1000.0000 mg | ORAL_CAPSULE | Freq: Two times a day (BID) | ORAL | 0 refills | Status: AC

## 2020-10-23 MED ORDER — MORPHINE SULFATE (PF) 4 MG/ML IV SOLN
4.0000 mg | Freq: Once | INTRAVENOUS | Status: AC
  Administered 2020-10-23: 4 mg via INTRAVENOUS
  Filled 2020-10-23: qty 1

## 2020-10-23 MED ORDER — LACTATED RINGERS IV BOLUS
500.0000 mL | Freq: Once | INTRAVENOUS | Status: AC
  Administered 2020-10-23: 500 mL via INTRAVENOUS

## 2020-10-23 MED ORDER — IBUPROFEN 600 MG PO TABS: 600.0000 mg | ORAL_TABLET | Freq: Four times a day (QID) | ORAL | 0 refills | Status: AC | PRN

## 2020-10-23 MED ORDER — SODIUM CHLORIDE 0.9 % IV SOLN
2.0000 g | Freq: Once | INTRAVENOUS | Status: AC
  Administered 2020-10-23: 2 g via INTRAVENOUS
  Filled 2020-10-23: qty 20

## 2020-10-23 NOTE — ED Provider Notes (Signed)
[facility] EMERGENCY DEPARTMENT Provider Note   CSN: [id number] Arrival date & time: 10/23/20  4034     History Chief Complaint  Patient presents with  . Flank Pain    Megan Terrell is a 35 y.o. female.  HPI Patient reports that she is having flank pain.  Is been off and on for 5 days.  Sometimes worse with movement.  Mild improvement with Tylenol.  Pain is variably sharp and stabbing or intense pressure.  She does report urinary frequency.  Discomfort at end of stream.  Patient reports she had a urinary tract infection several months ago was treated with an antibiotic with resolution of symptoms.  She reports very distant history of a kidney stone.  No recent cough fever chills or shortness of breath.    Past Medical History:  Diagnosis Date  . Medical history non-contributory     Patient Active Problem List   Diagnosis Date Noted  . Preterm labor 08/12/2016    Past Surgical History:  Procedure Laterality Date  . CESAREAN SECTION    . TOOTH EXTRACTION       OB History    Gravida  5   Para  3   Term  2   Preterm  1   AB  2   Living  3     SAB      IAB  2   Ectopic      Multiple  0   Live Births  3           No family history on file.  Social History   Tobacco Use  . Smoking status: Never Smoker  . Smokeless tobacco: Never Used  Substance Use Topics  . Alcohol use: No  . Drug use: No    Home Medications Prior to Admission medications   Medication Sig Start Date End Date Taking? Authorizing Provider  cephALEXin (KEFLEX) 500 MG capsule Take 2 capsules (1,000 mg total) by mouth 2 (two) times daily. 10/23/20  Yes Arby Barrette, MD  ibuprofen (ADVIL) 600 MG tablet Take 1 tablet (600 mg total) by mouth every 6 (six) hours as needed. 10/23/20  Yes Arby Barrette, MD  acetaminophen (TYLENOL) 500 MG tablet Take 1,000 mg by mouth every 6 (six) hours as needed for mild pain, moderate pain or headache.    [provider]   cephALEXin (KEFLEX) 500 MG capsule Take 1 capsule (500 mg total) by mouth 4 (four) times daily. 08/14/16   Montez Morita, CNM  ibuprofen (ADVIL,MOTRIN) 600 MG tablet Take 1 tablet (600 mg total) by mouth every 6 (six) hours. 08/14/16   Montez Morita, CNM  nystatin-triamcinolone (MYCOLOG II) cream Apply 1 application topically 2 (two) times daily as needed (for irritation).    [provider]  Prenatal MV-Min-FA-Omega-3 (PRENATAL GUMMIES/DHA & FA) 0.4-32.5 MG CHEW Chew 2 each by mouth daily.    [provider]    Allergies    Patient has no known allergies.  Review of Systems   Review of Systems 10 systems reviewed and negative except as per HPI Physical Exam Updated Vital Signs BP 119/81   Pulse 64   Temp 98.4 F (36.9 C) (Oral)   Resp 14   SpO2 99%   Physical Exam Constitutional:      Appearance: She is well-developed and well-nourished.  HENT:     Head: Normocephalic and atraumatic.  Eyes:     Extraocular Movements: EOM normal.     Pupils: Pupils  are equal, round, and reactive to light.  Cardiovascular:     Rate and Rhythm: Normal rate and regular rhythm.     Pulses: Intact distal pulses.     Heart sounds: Normal heart sounds.  Pulmonary:     Effort: Pulmonary effort is normal.     Breath sounds: Normal breath sounds.  Abdominal:     General: Bowel sounds are normal. There is no distension.     Palpations: Abdomen is soft.     Tenderness: There is no abdominal tenderness.  Musculoskeletal:        General: No edema. Normal range of motion.     Cervical back: Neck supple.  Skin:    General: Skin is warm, dry and intact.  Neurological:     Mental Status: She is alert and oriented to person, place, and time.     GCS: GCS eye subscore is 4. GCS verbal subscore is 5. GCS motor subscore is 6.     Coordination: Coordination normal.     Deep Tendon Reflexes: Strength normal.  Psychiatric:        Mood and Affect: Mood and affect normal.     ED  Results / Procedures / Treatments   Labs (all labs ordered are listed, but only abnormal results are displayed) Labs Reviewed  URINALYSIS, ROUTINE W REFLEX MICROSCOPIC - Abnormal; Notable for the following components:      Result Value   APPearance CLOUDY (*)    Hgb urine dipstick LARGE (*)    Protein, ur 30 (*)    Leukocytes,Ua LARGE (*)    WBC, UA >50 (*)    Non Squamous Epithelial 0-5 (*)    All other components within normal limits  CBC - Abnormal; Notable for the following components:   WBC 15.8 (*)    All other components within normal limits  BASIC METABOLIC PANEL - Abnormal; Notable for the following components:   Potassium 3.4 (*)    Glucose, Bld 124 (*)    All other components within normal limits  URINE CULTURE  CULTURE, BLOOD (ROUTINE X 2)  CULTURE, BLOOD (ROUTINE X 2)  LACTIC ACID, PLASMA  LACTIC ACID, PLASMA  I-STAT BETA HCG BLOOD, ED ([facility], [facility], [facility] ONLY)    EKG None  Radiology CT Renal Stone Study  Result Date: 10/23/2020 CLINICAL DATA:  Right flank pain EXAM: CT ABDOMEN AND PELVIS WITHOUT CONTRAST TECHNIQUE: Multidetector CT imaging of the abdomen and pelvis was performed following the standard protocol without IV contrast. COMPARISON:  None. FINDINGS: Lower chest: No acute abnormality. Hepatobiliary: No focal liver abnormality is seen. No gallstones, gallbladder wall thickening, or biliary dilatation. Pancreas: Unremarkable. No pancreatic ductal dilatation or surrounding inflammatory changes. Spleen: Normal in size without focal abnormality. Adrenals/Urinary Tract: Adrenal glands are unremarkable. No hydronephrosis. The ureters are unremarkable. There is a tiny punctate stone in the lower pole collecting system of the left kidney visible on the coronal images. No definite stones in the right collecting system. Bladder is unremarkable. Stomach/Bowel: Stomach is within normal limits. Appendix appears normal. No evidence of bowel wall thickening, distention, or  inflammatory changes. Vascular/Lymphatic: No significant vascular findings are present. No enlarged abdominal or pelvic lymph nodes. Reproductive: Uterus and bilateral adnexa are unremarkable. Other: No abdominal wall hernia or abnormality. No abdominopelvic ascites. Musculoskeletal: No acute or significant osseous findings. IMPRESSION: 1. Tiny punctate stone in the lower pole collecting system of the left kidney. No visible right-sided nephrolithiasis. 2. No evidence of hydronephrosis or other acute abnormality. Electronically  Signed   By: Jacqulynn Cadet M.D.   On: 10/23/2020 10:19    Procedures Procedures (including critical care time)  Medications Ordered in ED Medications  cefTRIAXone (ROCEPHIN) 2 g in sodium chloride 0.9 % 100 mL IVPB (0 g Intravenous Stopped 10/23/20 1104)  morphine 4 MG/ML injection 4 mg (4 mg Intravenous Given 10/23/20 1031)  lactated ringers bolus 500 mL (500 mLs Intravenous New Bag/Given 10/23/20 1211)    ED Course  I have reviewed the triage vital signs and the nursing notes.  Pertinent labs & imaging results that were available during my care of the patient were reviewed by me and considered in my medical decision making (see chart for details).    MDM Rules/Calculators/A&P                          Patient is nontoxic and alert.  Urine is grossly positive.  She also has leukocytosis.  Patient has unilateral flank pain.  Positive history of distant kidney stone.  We will proceed with IV antibiotics Rocephin 2 g.  Will obtain CT scan to rule out retained kidney stone.  Blood pressures are stable heart rate is normal.  No CT scan abnormality identified.  Patient is nontoxic.  Blood pressures are stable.  She does not have lactic acidosis.  Will start outpatient treatment with Keflex for pyelonephritis.  Return precautions reviewed.  Final Clinical Impression(s) / ED Diagnoses Final diagnoses:  Lower urinary tract infectious disease  Flank pain  Pyelonephritis     Rx / DC Orders ED Discharge Orders         Ordered    cephALEXin (KEFLEX) 500 MG capsule  2 times daily        10/23/20 1226    ibuprofen (ADVIL) 600 MG tablet  Every 6 hours PRN        10/23/20 1226           Charlesetta Shanks, MD 10/23/20 1228

## 2020-10-23 NOTE — Discharge Instructions (Signed)
1.  You have been given a dose of Rocephin in the emergency department.  This antibiotic treats infection for 24 hours.  Start your Keflex as prescribed tomorrow morning.  Take ibuprofen 600 mg every 6-8 hours for pain. 2.  Return to the emergency department if you develop nausea and vomiting, cannot take your medications, worsening pain, fever or general worsening symptoms. 3.  Try to schedule follow-up appointment with your doctor for recheck within the next 7 to 10 days.

## 2020-10-23 NOTE — ED Notes (Signed)
Patient Alert and oriented to baseline. Stable and ambulatory to baseline. Patient verbalized understanding of the discharge instructions.  Patient belongings were taken by the patient.   

## 2020-10-23 NOTE — ED Triage Notes (Signed)
Pt arrives via POV from home with right flank pain that 5 days ago. Denies recent fever, vomiting, diarrhea, sick contacts. Pt does c/o freq/urgency. vss.

## 2020-10-24 LAB — URINE CULTURE

## 2020-10-25 LAB — URINE CULTURE: Culture: >=100000 — AB

## 2020-10-28 LAB — CULTURE, BLOOD (ROUTINE X 2)
Culture: NO GROWTH
Culture: NO GROWTH
Special Requests: ADEQUATE

## 2021-03-19 IMAGING — CT CT RENAL STONE PROTOCOL
2 of 4 series · 16 of 46 positions shown, 18 images · non-contrast
Comparison: None.

CLINICAL DATA: Right flank pain

EXAM:
CT ABDOMEN AND PELVIS WITHOUT CONTRAST
TECHNIQUE: Multidetector CT imaging of the abdomen and pelvis was performed
following the standard protocol without IV contrast.

[Series 3: ap without · axial · non-contrast · 0.78mm/px · z∈[-340,+56]mm · 13 of 89 slices shown, 15 images]
[im 5/89  soft-tissue]
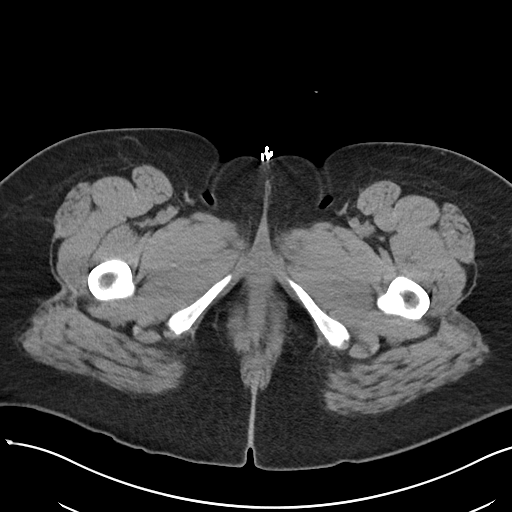
[im 5/89  bone]
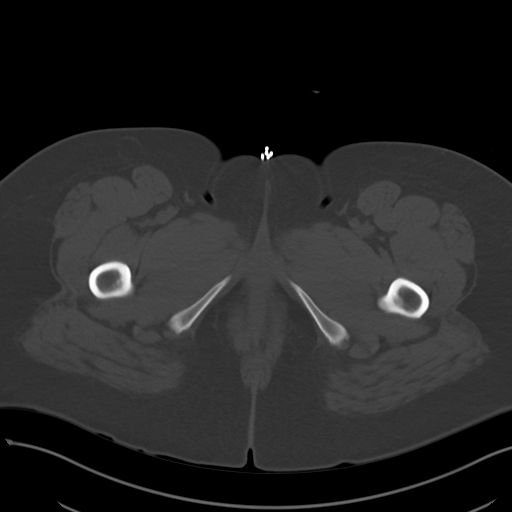
[im 14/89  soft-tissue]
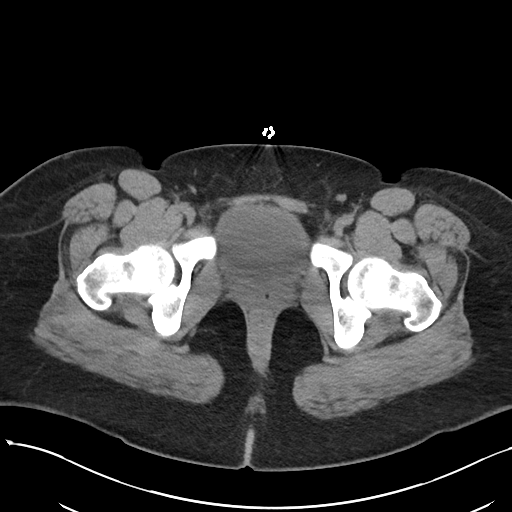
[im 18/89  soft-tissue]
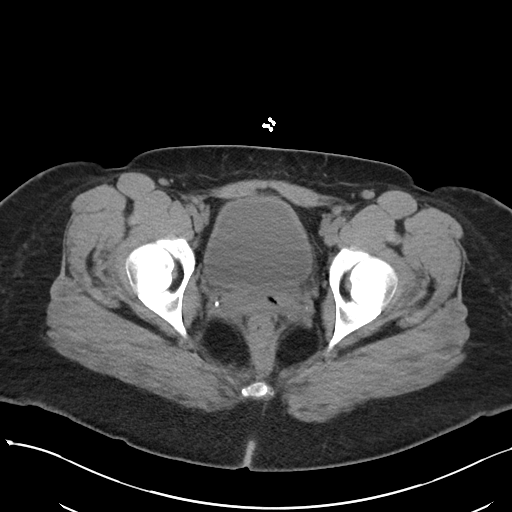
[im 27/89  soft-tissue]
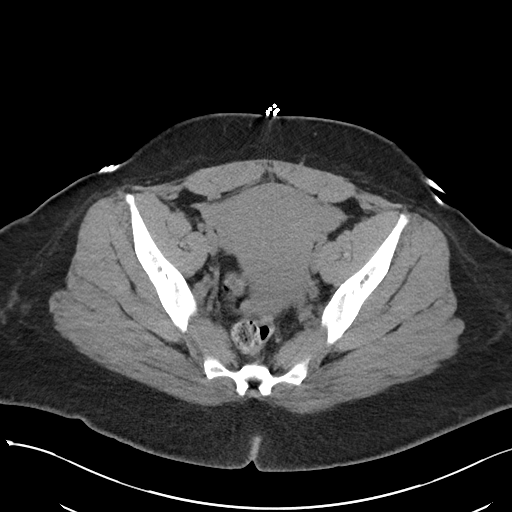
[im 31/89  soft-tissue]
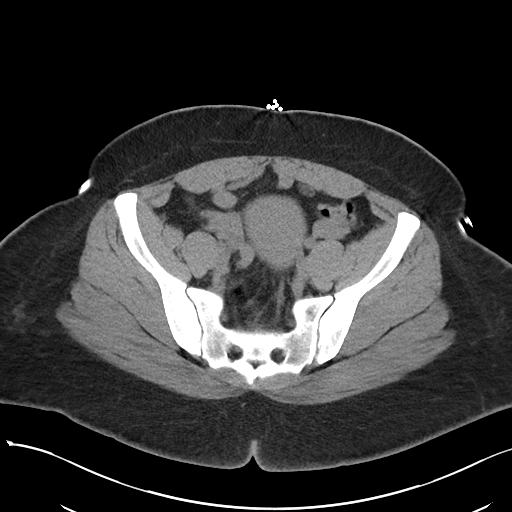
[im 40/89  soft-tissue]
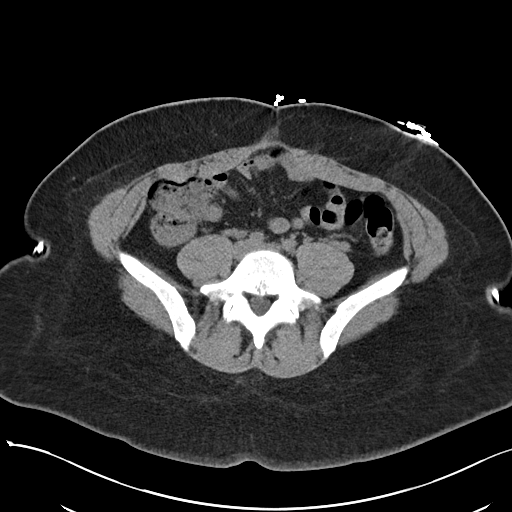
[im 45/89  soft-tissue]
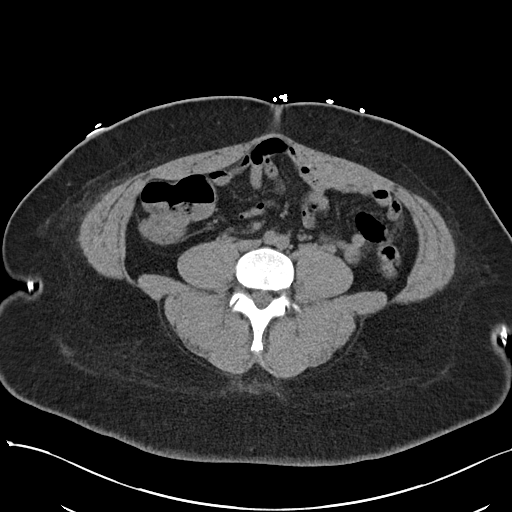
[im 49/89  soft-tissue]
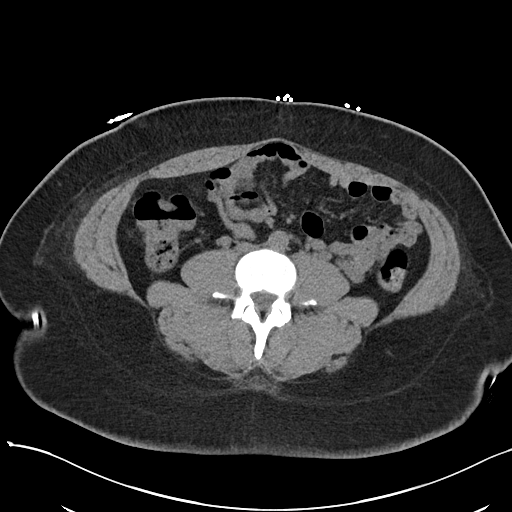
[im 58/89  soft-tissue]
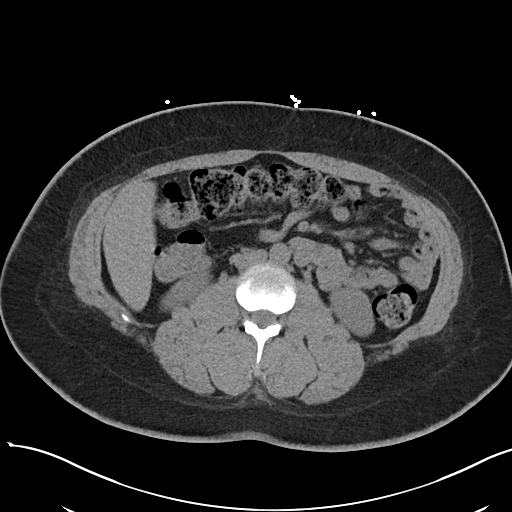
[im 58/89  bone]
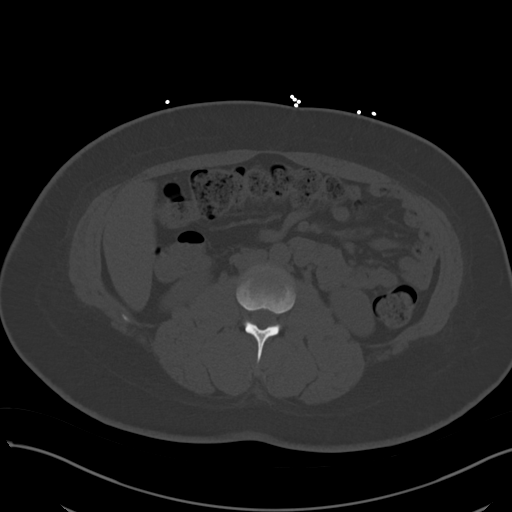
[im 62/89  soft-tissue]
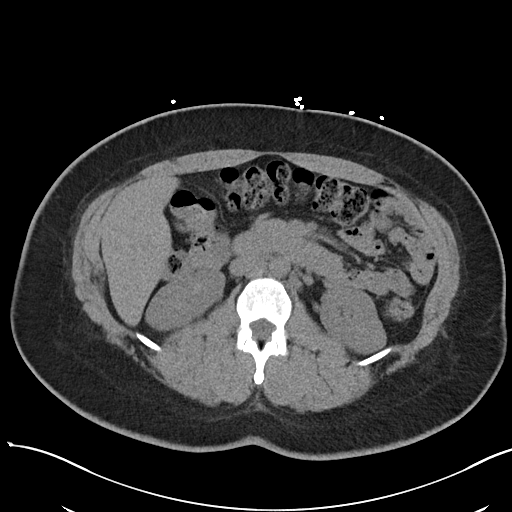
[im 71/89  soft-tissue]
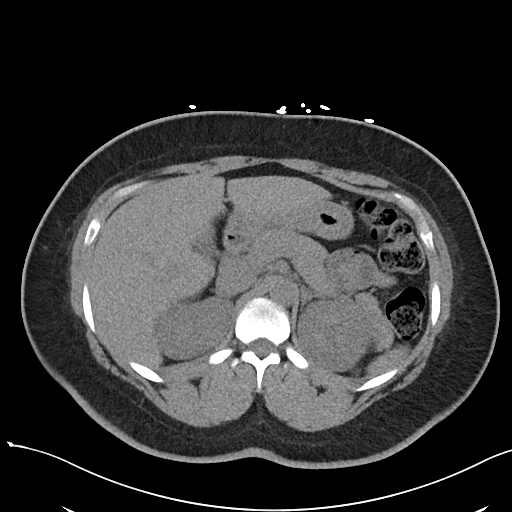
[im 75/89  soft-tissue]
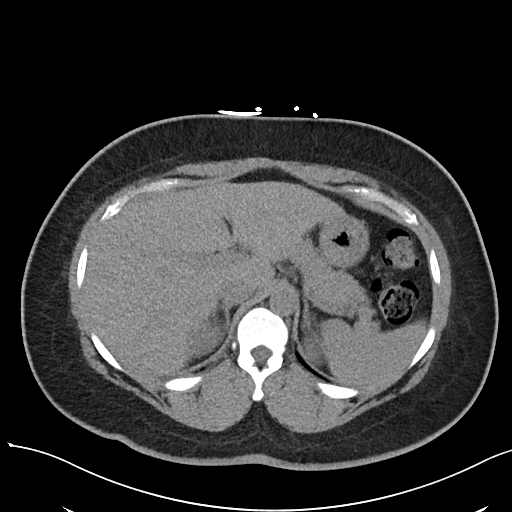
[im 84/89  soft-tissue]
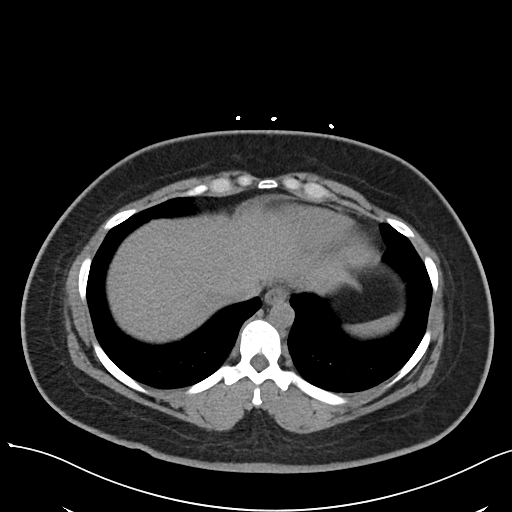

[Series 6: cor · coronal · 0.76mm/px · 3 of 100 slices shown]
[im 34/100  soft-tissue]
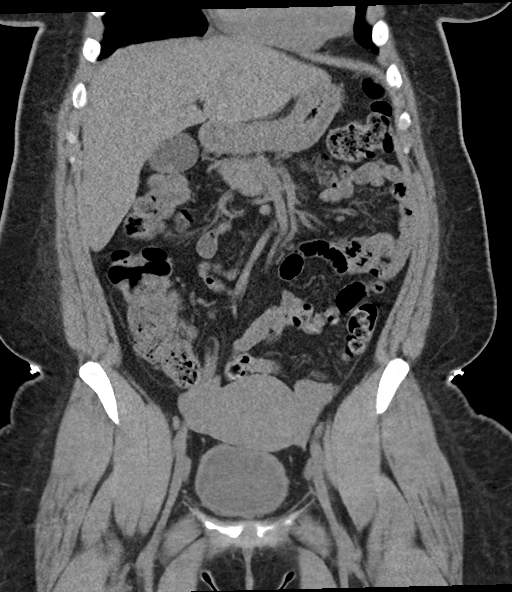
[im 45/100  soft-tissue]
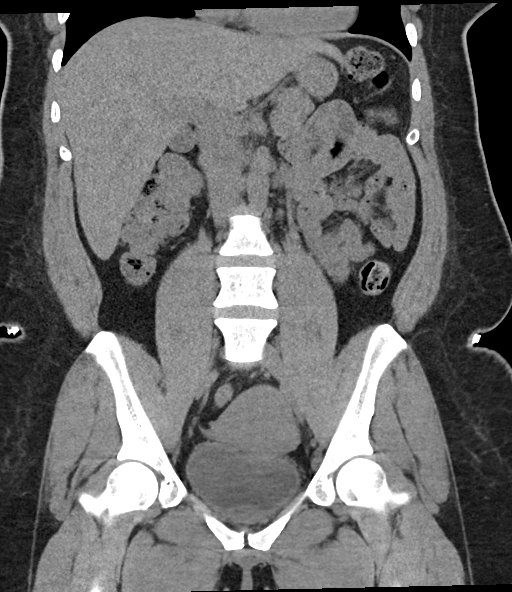
[im 56/100  soft-tissue]
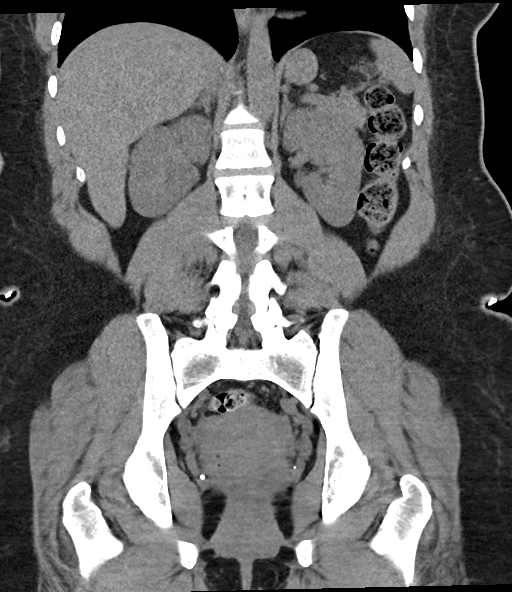

[16 of 46 positions shown; findings below may reference images not displayed]

FINDINGS: Lower chest: No acute abnormality.

Hepatobiliary: No focal liver abnormality is seen. No gallstones,
gallbladder wall thickening, or biliary dilatation.

Pancreas: Unremarkable. No pancreatic ductal dilatation or
surrounding inflammatory changes.

Spleen: Normal in size without focal abnormality.

Adrenals/Urinary Tract: Adrenal glands are unremarkable. No
hydronephrosis. The ureters are unremarkable. There is a tiny
punctate stone in the lower pole collecting system of the left
kidney visible on the coronal images. No definite stones in the
right collecting system. Bladder is unremarkable.

Stomach/Bowel: Stomach is within normal limits. Appendix appears
normal. No evidence of bowel wall thickening, distention, or
inflammatory changes.

Vascular/Lymphatic: No significant vascular findings are present. No
enlarged abdominal or pelvic lymph nodes.

Reproductive: Uterus and bilateral adnexa are unremarkable.

Other: No abdominal wall hernia or abnormality. No abdominopelvic
ascites.

Musculoskeletal: No acute or significant osseous findings.
IMPRESSION: 1. Tiny punctate stone in the lower pole collecting system of the
left kidney. No visible right-sided nephrolithiasis.
2. No evidence of hydronephrosis or other acute abnormality.
